# Patient Record
Sex: Male | Born: 1970 | Race: White | Hispanic: Yes | Marital: Married | Smoking: Never smoker
Health system: Southern US, Community
[De-identification: ages and names within clinical notes are randomized; demographics above are authoritative.]

## PROBLEM LIST (undated history)

## (undated) HISTORY — PX: APPENDECTOMY: SHX54

---

## 1998-04-01 ENCOUNTER — Inpatient Hospital Stay (HOSPITAL_COMMUNITY): Admission: EM | Admit: 1998-04-01 | Discharge: 1998-04-03 | Payer: Self-pay | Admitting: Emergency Medicine

## 2009-01-21 ENCOUNTER — Ambulatory Visit: Payer: Self-pay | Admitting: Diagnostic Radiology

## 2009-01-21 ENCOUNTER — Emergency Department (HOSPITAL_BASED_OUTPATIENT_CLINIC_OR_DEPARTMENT_OTHER): Admission: EM | Admit: 2009-01-21 | Discharge: 2009-01-21 | Payer: Self-pay | Admitting: Emergency Medicine
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
## 2015-03-10 ENCOUNTER — Ambulatory Visit (INDEPENDENT_AMBULATORY_CARE_PROVIDER_SITE_OTHER): Payer: Self-pay | Admitting: Family Medicine

## 2015-03-10 VITALS — BP 102/60 | HR 71 | Temp 98.0°F | Resp 18 | Ht 66.0 in | Wt 177.0 lb

## 2015-03-10 DIAGNOSIS — B354 Tinea corporis: Secondary | ICD-10-CM

## 2015-03-10 DIAGNOSIS — L309 Dermatitis, unspecified: Secondary | ICD-10-CM

## 2015-03-10 DIAGNOSIS — R21 Rash and other nonspecific skin eruption: Secondary | ICD-10-CM

## 2015-03-10 LAB — POCT SKIN KOH: SKIN KOH, POC: POSITIVE

## 2015-03-10 MED ORDER — TRIAMCINOLONE ACETONIDE 0.1 % EX CREA: 1.0000 "application " | TOPICAL_CREAM | Freq: Two times a day (BID) | CUTANEOUS | Status: AC

## 2015-03-10 MED ORDER — TERBINAFINE HCL 250 MG PO TABS: 250.0000 mg | ORAL_TABLET | Freq: Every day | ORAL | Status: AC

## 2015-03-10 NOTE — Patient Instructions (Signed)
Take terbinafine 250 mg one daily for 2 weeks for rash on face  Use over-the-counter clotrimazole cream once or twice daily on the rash if it persists after taking the oral medication  If you keep having the rash on the face after 1 month please return  Use the triamcinolone cream a small amount once or twice daily on the rash on the buttock and thighs.  Return if needed

## 2015-03-10 NOTE — Progress Notes (Signed)
Rash on face and buttock Subjective:  Patient ID: Kenneth Kent, male    DOB: [birth year]  Age: 44 y.o. MRN: [id number]  44 year old man who has been having problems with a rash on his right cheek for over 6 months. He saw a dermatologist who said it was a fungus. They treated him with a cream and with some oral medication. It may have gotten a little better, but then it came back and is persisted. He has not been back to the dermatologist for it, having been down in [place] for a while.  He now complains of two-week history of a rash on his buttock. It is on the right cheek and now down toward the thighs. It itches.  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   Objective:   Slightly macular and papular rash on the right cheek. None seen on the bridge of the nose on left cheek. Using a #15 blade I scraped a small amount of skin cells for microscopic KOH evaluation.  His right buttock has a couple of red bumps, nonpustular. There is mild erythema and follicular prominence down toward the medial posterior thigh area bilaterally.  Assessment & Plan:   Assessment:  Facial rash Nonspecific dermatitis of buttock  Plan:  Skin scraping of face since he was told it was fungus previously. If this is negative will probably also want to check an ANA.  Results for orders placed or performed in visit on 03/10/15  POCT Skin KOH  Result Value Ref Range   Skin KOH, POC Positive    tinea corporis on face Probably and eczematoid dermatitis on the buttock  See instructions   Patient Instructions  Take terbinafine 250 mg one daily for 2 weeks for rash on face  Use over-the-counter clotrimazole cream once or twice daily on the rash if it persists after taking the oral medication  If you keep having the rash on the face after 1 month please return  Use the triamcinolone cream a small amount once or twice daily on the rash on  the buttock and thighs.  Return if needed     HOPPER,DAVID, MD 03/10/2015

## 2015-04-08 ENCOUNTER — Encounter (HOSPITAL_COMMUNITY): Payer: Self-pay | Admitting: *Deleted

## 2015-04-08 ENCOUNTER — Emergency Department (HOSPITAL_COMMUNITY)
Admission: EM | Admit: 2015-04-08 | Discharge: 2015-04-08 | Disposition: A | Discharge status: Home / Self Care | Payer: Self-pay | Attending: Emergency Medicine | Admitting: Emergency Medicine

## 2015-04-08 DIAGNOSIS — Z23 Encounter for immunization: Secondary | ICD-10-CM | POA: Insufficient documentation

## 2015-04-08 DIAGNOSIS — Y9289 Other specified places as the place of occurrence of the external cause: Secondary | ICD-10-CM | POA: Insufficient documentation

## 2015-04-08 DIAGNOSIS — Y9389 Activity, other specified: Secondary | ICD-10-CM | POA: Insufficient documentation

## 2015-04-08 DIAGNOSIS — W25XXXA Contact with sharp glass, initial encounter: Secondary | ICD-10-CM | POA: Insufficient documentation

## 2015-04-08 DIAGNOSIS — S51812A Laceration without foreign body of left forearm, initial encounter: Secondary | ICD-10-CM | POA: Insufficient documentation

## 2015-04-08 DIAGNOSIS — Y998 Other external cause status: Secondary | ICD-10-CM | POA: Insufficient documentation

## 2015-04-08 DIAGNOSIS — S41112A Laceration without foreign body of left upper arm, initial encounter: Secondary | ICD-10-CM

## 2015-04-08 MED ORDER — CEPHALEXIN 500 MG PO CAPS: 500.0000 mg | ORAL_CAPSULE | Freq: Two times a day (BID) | ORAL | Status: AC

## 2015-04-08 MED ORDER — OXYCODONE-ACETAMINOPHEN 5-325 MG PO TABS
1.0000 | ORAL_TABLET | Freq: Once | ORAL | Status: AC
Start: 2015-04-08 — End: 2015-04-08
  Administered 2015-04-08: 1 via ORAL
  Filled 2015-04-08: qty 1

## 2015-04-08 MED ORDER — CEPHALEXIN 250 MG PO CAPS
500.0000 mg | ORAL_CAPSULE | Freq: Once | ORAL | Status: AC
  Administered 2015-04-08: 500 mg via ORAL
  Filled 2015-04-08: qty 2

## 2015-04-08 MED ORDER — LIDOCAINE-EPINEPHRINE (PF) 2 %-1:200000 IJ SOLN
20.0000 mL | Freq: Once | INTRAMUSCULAR | Status: AC
  Administered 2015-04-08: 20 mL
  Filled 2015-04-08: qty 20

## 2015-04-08 MED ORDER — TETANUS-DIPHTH-ACELL PERTUSSIS 5-2.5-18.5 LF-MCG/0.5 IM SUSP
0.5000 mL | Freq: Once | INTRAMUSCULAR | Status: AC
  Administered 2015-04-08: 0.5 mL via INTRAMUSCULAR
  Filled 2015-04-08: qty 0.5

## 2015-04-08 MED ORDER — HYDROCODONE-ACETAMINOPHEN 5-325 MG PO TABS: 1.0000 | ORAL_TABLET | ORAL | Status: AC | PRN

## 2015-04-08 NOTE — Discharge Instructions (Signed)
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
## 2023-03-05 ENCOUNTER — Institutional Professional Consult (permissible substitution): Payer: Self-pay | Admitting: Adult Health

## 2023-03-27 ENCOUNTER — Encounter: Payer: Self-pay | Admitting: Adult Health

## 2024-07-06 ENCOUNTER — Emergency Department (HOSPITAL_COMMUNITY)
Admission: EM | Admit: 2024-07-06 | Discharge: 2024-07-06 | Disposition: A | Discharge status: Home / Self Care | Payer: Self-pay | Attending: Emergency Medicine | Admitting: Emergency Medicine

## 2024-07-06 DIAGNOSIS — M549 Dorsalgia, unspecified: Secondary | ICD-10-CM | POA: Insufficient documentation

## 2024-07-06 DIAGNOSIS — Z5321 Procedure and treatment not carried out due to patient leaving prior to being seen by health care provider: Secondary | ICD-10-CM | POA: Insufficient documentation

## 2024-07-06 NOTE — ED Triage Notes (Signed)
 Patient advised RN that they are leaving .
